# Patient Record
Sex: Male | Born: 2000 | Race: Black or African American | Hispanic: No | Marital: Single | Smoking: Current some day smoker
Health system: Southern US, Community
[De-identification: ages and names within clinical notes are randomized; demographics above are authoritative.]

## PROBLEM LIST (undated history)

## (undated) DIAGNOSIS — M4184 Other forms of scoliosis, thoracic region: Secondary | ICD-10-CM

## (undated) DIAGNOSIS — J45909 Unspecified asthma, uncomplicated: Secondary | ICD-10-CM

## (undated) HISTORY — DX: Unspecified asthma, uncomplicated: J45.909

## (undated) HISTORY — DX: Other forms of scoliosis, thoracic region: M41.84

---
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
## 2019-08-18 ENCOUNTER — Other Ambulatory Visit: Payer: Self-pay

## 2020-07-26 ENCOUNTER — Other Ambulatory Visit: Payer: Self-pay

## 2020-07-26 ENCOUNTER — Ambulatory Visit (INDEPENDENT_AMBULATORY_CARE_PROVIDER_SITE_OTHER): Payer: Medicaid Other | Admitting: Pulmonary Disease

## 2020-07-26 ENCOUNTER — Encounter: Payer: Self-pay | Admitting: Pulmonary Disease

## 2020-07-26 VITALS — BP 118/68 | HR 63 | Temp 97.7°F | Ht 76.0 in | Wt 171.8 lb

## 2020-07-26 DIAGNOSIS — Z7689 Persons encountering health services in other specified circumstances: Secondary | ICD-10-CM

## 2020-07-26 DIAGNOSIS — F17290 Nicotine dependence, other tobacco product, uncomplicated: Secondary | ICD-10-CM | POA: Diagnosis not present

## 2020-07-26 DIAGNOSIS — J45909 Unspecified asthma, uncomplicated: Secondary | ICD-10-CM

## 2020-07-26 DIAGNOSIS — J302 Other seasonal allergic rhinitis: Secondary | ICD-10-CM

## 2020-07-26 DIAGNOSIS — J3089 Other allergic rhinitis: Secondary | ICD-10-CM

## 2020-07-26 MED ORDER — AZELASTINE HCL 0.1 % NA SOLN: 1.0000 | Freq: Two times a day (BID) | NASAL | 12 refills | Status: DC

## 2020-07-26 MED ORDER — ALBUTEROL SULFATE HFA 108 (90 BASE) MCG/ACT IN AERS: 2.0000 | INHALATION_SPRAY | Freq: Four times a day (QID) | RESPIRATORY_TRACT | 3 refills | Status: DC | PRN

## 2020-07-26 NOTE — Patient Instructions (Addendum)
I recommend you get breathing tests and an allergy test that can be run in the blood.  However, you need to have a primary care physician to approve these tests.  I recommend that you call your social worker to get set up with a primary care physician.  We are going to send a new prescription for a nasal spray.  Over-the-counter you can get a medication called Zyrtec (cetirizine) that he can take 1 tablet daily as needed for nasal congestion  We will see you in follow-up in 3 months time call sooner should any new problems arise

## 2020-07-26 NOTE — Progress Notes (Signed)
Subjective:    Patient ID: Joshua Garret., male    DOB: [birth year], 20 y.o.   MRN: [id number]  HPI This is a 20 year old current cigar and marijuana smoker, who presents for evaluation of nasal congestion and postnasal drip.  Patient is self-referred.  Currently does not have a primary care physician.  Somewhat taciturn and does not endorse much of a history.  States that he was diagnosed with asthma at age 20.  However currently his complaint is not that of shortness of breath but rather of nasal congestion with severe postnasal drip.  He uses an albuterol inhaler as needed.  Recently stopped using his Flonase inhaler for perennial rhinitis.  He had been previously prescribed Flovent HFA but is not using that inhaler anymore.  He cannot tell me when he stopped using the inhaler.  He smokes marijuana on a daily basis and smokes cigars at least 3 times a week.  He has not had any military history.  He works at [employer] and for a [employer].   Review of Systems A 10 point review of systems was performed and it is as noted above otherwise negative.  Past Medical History:  Diagnosis Date  . Asthma   . Levoscoliosis of thoracic spine    No past surgical history on file.  Family History  Problem Relation Age of Onset  . Asthma Mother    Social History   Tobacco Use  . Smoking status: Current Some Day Smoker    Years: 4.00    Types: Cigars  . Smokeless tobacco: Never Used  . Tobacco comment: 1 cigar every 3 days--07/26/2020  Substance Use Topics  . Alcohol use: Not Currently   Not on File  Current Meds  Medication Sig  . albuterol (VENTOLIN HFA) 108 (90 Base) MCG/ACT inhaler Inhale 2 puffs into the lungs every 6 (six) hours as needed for wheezing or shortness of breath.  . fluticasone (FLONASE) 50 MCG/ACT nasal spray 2 sprays daily.       Immunization History  Administered Date(s) Administered  . Influenza-Unspecified 05/16/2014      Objective:   Physical Exam BP  118/68 (BP Location: Left Arm, Cuff Size: Normal)   Pulse 63   Temp 97.7 F (36.5 C) (Temporal)   Ht 6\' 4"  (1.93 m)   Wt 171 lb 12.8 oz (77.9 kg)   SpO2 98%   BMI 20.91 kg/m  GENERAL: Well-developed well-nourished young man, somewhat taciturn.  No acute distress.  No conversational dyspnea. HEAD: Normocephalic, atraumatic.  EYES: Pupils equal, round, reactive to light.  No scleral icterus.  MOUTH: Nasal mucosa boggy, erythematous, crusted.  Pharynx with clear post nasal drip.   NECK: Supple. No thyromegaly. Trachea midline. No JVD.  No adenopathy. PULMONARY: Good air entry bilaterally.  No adventitious sounds. CARDIOVASCULAR: S1 and S2. Regular rate and rhythm.  No rubs, murmurs or gallops heard. ABDOMEN: Benign. MUSCULOSKELETAL: No joint deformity, no clubbing, no edema.  NEUROLOGIC: No focal deficit noted, speech is fluent, no gait disturbance. SKIN: Intact,warm,dry.  On limited exam, no rashes. PSYCH: Taciturn otherwise normal.     Assessment & Plan:     ICD-10-CM   1. Asthma, unspecified asthma severity, unspecified whether complicated, unspecified whether persistent  J45.909 Pulmonary Function Test [facility] Only    CBC w/Diff    Allergen Panel (27) + IGE   Will obtain PFTs Will obtain allergen panel Currently does not appear to have any exacerbation of this Continue albuterol as needed  2.  Perennial allergic rhinitis with seasonal variation  J30.89    J30.2    Trial of azelastine Cetirizine as needed Allergen panel as above May need allergy or ENT eval  3. 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 Establishing care with new doctor, encounter for  Z76.89 Ambulatory referral to Internal Medicine   Patient has been advised that he needs to establish with a primary care physician Establishing here for pulmonary medicine   Orders Placed This Encounter  Procedures  . CBC w/Diff     Standing Status:   Future    Standing Expiration Date:   07/26/2021  . Allergen Panel (27) + IGE    Standing Status:   Future    Standing Expiration Date:   07/26/2021  . Ambulatory referral to Internal Medicine    Referral Priority:   Routine    Referral Type:   Consultation    Referral Reason:   Specialty Services Required    Requested Specialty:   Internal Medicine    Number of Visits Requested:   1  . Pulmonary Function Test [facility] Only    Standing Status:   Future    Standing Expiration Date:   07/26/2021    Scheduling Instructions:     4 weeks    Order Specific Question:   Full PFT: includes the following: basic spirometry, spirometry pre & post bronchodilator, diffusion capacity (DLCO), lung volumes    Answer:   Full PFT   Meds ordered this encounter  Medications  . albuterol (VENTOLIN HFA) 108 (90 Base) MCG/ACT inhaler    Sig: Inhale 2 puffs into the lungs every 6 (six) hours as needed for wheezing or shortness of breath.    Dispense:  8 g    Refill:  3  . azelastine (ASTELIN) 0.1 % nasal spray    Sig: Place 1 spray into both nostrils 2 (two) times daily.    Dispense:  30 mL    Refill:  12   Discussion:  Patient carries a diagnosis of asthma since childhood.  However does not appear to have an exacerbation at present.  Will obtain PFTs to determine the severity of this.  Currently his issues appear to be more related to upper airway congestion and particular nasal congestion and postnasal drip.  He has been advised on proper nasal hygiene.  Also been advised on discontinuing smoking cigars and marijuana.  Smoking in general causes issues with upper airway sensitivity.  The patient also has been advised that he needs to find a primary care physician to follow-up on general medical issues.  Gailen Shelter, MD [facility]   *This note was dictated using voice recognition software/Dragon.  Despite best efforts to proofread, errors can occur which can change the meaning.  Any  change was purely unintentional.

## 2020-08-01 ENCOUNTER — Other Ambulatory Visit: Payer: Medicaid Other | Attending: Pulmonary Disease

## 2020-08-02 ENCOUNTER — Ambulatory Visit: Payer: Medicaid Other | Attending: Pulmonary Disease

## 2021-03-27 ENCOUNTER — Encounter: Payer: Self-pay | Admitting: Otolaryngology

## 2021-04-01 NOTE — Discharge Instructions (Signed)
[facility] ENDOSCOPIC SINUS SURGERY [facility]  What is Functional Endoscopic Sinus Surgery?  The Surgery involves making the natural openings of the sinuses larger by removing the bony partitions that separate the sinuses from the nasal cavity.  The natural sinus lining is preserved as much as possible to allow the sinuses to resume normal function after the surgery.  In some patients nasal polyps (excessively swollen lining of the sinuses) may be removed to relieve obstruction of the sinus openings.  The surgery is performed through the nose using lighted scopes, which eliminates the need for incisions on the face.  A septoplasty is a different procedure which is sometimes performed with sinus surgery.  It involves straightening the boy partition that separates the two sides of your nose.  A crooked or deviated septum may need repair if is obstructing the sinuses or nasal airflow.  Turbinate reduction is also often performed during sinus surgery.  The turbinates are bony proturberances from the side walls of the nose which swell and can obstruct the nose in patients with sinus and allergy problems.  Their size can be surgically reduced to help relieve nasal obstruction.  What Can Sinus Surgery Do For Me?  Sinus surgery can reduce the frequency of sinus infections requiring antibiotic treatment.  This can provide improvement in nasal congestion, post-nasal drainage, facial pressure and nasal obstruction.  Surgery will NOT prevent you from ever having an infection again, so it usually only for patients who get infections 4 or more times yearly requiring antibiotics, or for infections that do not clear with antibiotics.  It will not cure nasal allergies, so patients with allergies may still require medication to treat their allergies after surgery. Surgery may improve headaches related to sinusitis, however, some people will continue to  require medication to control sinus headaches related to allergies.  Surgery will do nothing for other forms of headache (migraine, tension or cluster).  What Are the Risks of Endoscopic Sinus Surgery?  Current techniques allow surgery to be performed safely with little risk, however, there are rare complications that patients should be aware of.  Because the sinuses are located around the eyes, there is risk of eye injury, including blindness, though again, this would be quite rare. This is usually a result of bleeding behind the eye during surgery, which can effect vision, though there are treatments to protect the vision and prevent permanent injury. More serious complications would include bleeding inside the brain cavity or damage to the brain.This happens when the fluid around the brain leaks out into the sinus cavity.  Again, all of these complications are uncommon, and spinal fluid leaks can be safely managed surgically if they occur.  The most common complication of sinus surgery is bleeding from the nose, which may require packing or cauterization of the nose.  Patients with polyps may experience recurrence of the polyps that would require revision surgery.  Alterations of sense of smell or injury to the tear ducts are also rare complications.   What is the Surgery Like, and what is the Recovery?  The Surgery usually takes a couple of hours to perform, and is usually performed under a general anesthetic (completely asleep).  Patients are usually discharged home after a couple of hours.  Sometimes during surgery it is necessary to pack the nose to control bleeding, and the packing is left in place for 24 - 48 hours, and removed by your surgeon.  If   a septoplasty was performed during the procedure, there is often a splint placed which must be removed after 5-7 days.   Discomfort: Pain is usually mild to moderate, and can be controlled by prescription pain medication or acetaminophen (Tylenol).   Aspirin, Ibuprofen (Advil, Motrin), or Naprosyn (Aleve) should be avoided, as they can cause increased bleeding.  Most patients feel sinus pressure like they have a bad head cold for several days.  Sleeping with your head elevated can help reduce swelling and facial pressure, as can ice packs over the face.  A humidifier may be helpful to keep the mucous and blood from drying in the nose.   Diet: There are no specific diet restrictions, however, you should generally start with clear liquids and a light diet of bland foods because the anesthetic can cause some nausea.  Advance your diet depending on how your stomach feels.  Taking your pain medication with food will often help reduce stomach upset which pain medications can cause.  Nasal Saline Irrigation: It is important to remove blood clots and dried mucous from the nose as it is healing.  This is done by having you irrigate the nose at least 3 - 4 times daily with a salt water solution.  We recommend using NeilMed Sinus Rinse (available at the drug store).  Fill the squeeze bottle with the solution, bend over a sink, and insert the tip of the squeeze bottle into the nose  of an inch.  Point the tip of the squeeze bottle towards the inside corner of the eye on the same side your irrigating.  Squeeze the bottle and gently irrigate the nose.  If you bend forward as you do this, most of the fluid will flow back out of the nose, instead of down your throat.   The solution should be warm, near body temperature, when you irrigate.   Each time you irrigate, you should use a full squeeze bottle.   Note that if you are instructed to use Nasal Steroid Sprays at any time after your surgery, irrigate with saline BEFORE using the steroid spray, so you do not wash it all out of the nose. Another product, Nasal Saline Gel (such as AYR Nasal Saline Gel) can be applied in each nostril 3 - 4 times daily to moisture the nose and reduce scabbing or crusting.  Bleeding:   Bloody drainage from the nose can be expected for several days, and patients are instructed to irrigate their nose frequently with salt water to help remove mucous and blood clots.  The drainage may be dark red or brown, though some fresh blood may be seen intermittently, especially after irrigation.  Do not blow you nose, as bleeding may occur. If you must sneeze, keep your mouth open to allow air to escape through your mouth.  If heavy bleeding occurs: Irrigate the nose with saline to rinse out clots, then spray the nose 3 - 4 times with Afrin Nasal Decongestant Spray.  The spray will constrict the blood vessels to slow bleeding.  Pinch the lower half of your nose shut to apply pressure, and lay down with your head elevated.  Ice packs over the nose may help as well. If bleeding persists despite these measures, you should notify your doctor.  Do not use the Afrin routinely to control nasal congestion after surgery, as it can result in worsening congestion and may affect healing.     Activity: Return to work varies among patients. Most patients will be out   of work at least 5 - 7 days to recover.  Patient may return to work after they are off of narcotic pain medication, and feeling well enough to perform the functions of their job.  Patients must avoid heavy lifting (over 10 pounds) or strenuous physical for 2 weeks after surgery, so your employer may need to assign you to light duty, or keep you out of work longer if light duty is not possible.  NOTE: you should not drive, operate dangerous machinery, do any mentally demanding tasks or make any important legal or financial decisions while on narcotic pain medication and recovering from the general anesthetic.    Call Your Doctor Immediately if You Have Any of the Following: Bleeding that you cannot control with the above measures Loss of vision, double vision, bulging of the eye or black eyes. Fever over 101 degrees Neck stiffness with severe headache,  fever, nausea and change in mental state. You are always encouraged to call anytime with concerns, however, please call with requests for pain medication refills during office hours.  Office Endoscopy: During follow-up visits your doctor will remove any packing or splints that may have been placed and evaluate and clean your sinuses endoscopically.  Topical anesthetic will be used to make this as comfortable as possible, though you may want to take your pain medication prior to the visit.  How often this will need to be done varies from patient to patient.  After complete recovery from the surgery, you may need follow-up endoscopy from time to time, particularly if there is concern of recurrent infection or nasal polyps.  

## 2021-04-04 ENCOUNTER — Ambulatory Visit
Admission: RE | Admit: 2021-04-04 | Discharge: 2021-04-04 | Disposition: A | Discharge status: Home / Self Care | Payer: Medicaid Other | Source: Ambulatory Visit | Attending: Otolaryngology | Admitting: Otolaryngology

## 2021-04-04 ENCOUNTER — Ambulatory Visit: Payer: Medicaid Other | Admitting: Anesthesiology

## 2021-04-04 ENCOUNTER — Encounter: Payer: Self-pay | Admitting: Otolaryngology

## 2021-04-04 ENCOUNTER — Encounter
Admission: RE | Disposition: A | Discharge status: Home / Self Care | Payer: Self-pay | Source: Ambulatory Visit | Attending: Otolaryngology

## 2021-04-04 ENCOUNTER — Other Ambulatory Visit: Payer: Self-pay

## 2021-04-04 DIAGNOSIS — J343 Hypertrophy of nasal turbinates: Secondary | ICD-10-CM | POA: Diagnosis not present

## 2021-04-04 DIAGNOSIS — F17200 Nicotine dependence, unspecified, uncomplicated: Secondary | ICD-10-CM | POA: Insufficient documentation

## 2021-04-04 DIAGNOSIS — J342 Deviated nasal septum: Secondary | ICD-10-CM | POA: Diagnosis not present

## 2021-04-04 DIAGNOSIS — J45909 Unspecified asthma, uncomplicated: Secondary | ICD-10-CM | POA: Insufficient documentation

## 2021-04-04 HISTORY — PX: TURBINATE REDUCTION: SHX6157

## 2021-04-04 HISTORY — PX: SEPTOPLASTY: SHX2393

## 2021-04-04 SURGERY — SEPTOPLASTY, NOSE
Anesthesia: General | Site: Nose

## 2021-04-04 MED ORDER — FENTANYL CITRATE PF 50 MCG/ML IJ SOSY
25.0000 ug | PREFILLED_SYRINGE | INTRAMUSCULAR | Status: DC | PRN
  Administered 2021-04-04: 25 ug via INTRAVENOUS

## 2021-04-04 MED ORDER — OXYCODONE HCL 5 MG/5ML PO SOLN
5.0000 mg | Freq: Once | ORAL | Status: AC | PRN
  Administered 2021-04-04: 5 mg via ORAL

## 2021-04-04 MED ORDER — PROPOFOL 10 MG/ML IV BOLUS
INTRAVENOUS | Status: DC | PRN
  Administered 2021-04-04: 200 mg via INTRAVENOUS

## 2021-04-04 MED ORDER — PHENYLEPHRINE HCL 0.5 % NA SOLN
NASAL | Status: DC | PRN
  Administered 2021-04-04: 15 mL via TOPICAL

## 2021-04-04 MED ORDER — ONDANSETRON HCL 4 MG/2ML IJ SOLN
INTRAMUSCULAR | Status: DC | PRN
  Administered 2021-04-04: 4 mg via INTRAVENOUS

## 2021-04-04 MED ORDER — HYDROCODONE-ACETAMINOPHEN 5-325 MG PO TABS: 1.0000 | ORAL_TABLET | Freq: Four times a day (QID) | ORAL | 0 refills | Status: AC | PRN

## 2021-04-04 MED ORDER — ACETAMINOPHEN 160 MG/5ML PO SOLN: 325.0000 mg | ORAL | Status: DC | PRN

## 2021-04-04 MED ORDER — CEPHALEXIN 500 MG PO CAPS: 500.0000 mg | ORAL_CAPSULE | Freq: Two times a day (BID) | ORAL | 0 refills | Status: AC

## 2021-04-04 MED ORDER — DEXMEDETOMIDINE (PRECEDEX) IN NS 20 MCG/5ML (4 MCG/ML) IV SYRINGE
PREFILLED_SYRINGE | INTRAVENOUS | Status: DC | PRN
  Administered 2021-04-04: 10 ug via INTRAVENOUS

## 2021-04-04 MED ORDER — SUCCINYLCHOLINE CHLORIDE 200 MG/10ML IV SOSY
PREFILLED_SYRINGE | INTRAVENOUS | Status: DC | PRN
  Administered 2021-04-04: 100 mg via INTRAVENOUS

## 2021-04-04 MED ORDER — GLYCOPYRROLATE 0.2 MG/ML IJ SOLN
INTRAMUSCULAR | Status: DC | PRN
  Administered 2021-04-04: .1 mg via INTRAVENOUS

## 2021-04-04 MED ORDER — OXYMETAZOLINE HCL 0.05 % NA SOLN
2.0000 | Freq: Once | NASAL | Status: AC
  Administered 2021-04-04: 2 via NASAL

## 2021-04-04 MED ORDER — DEXAMETHASONE SODIUM PHOSPHATE 4 MG/ML IJ SOLN
INTRAMUSCULAR | Status: DC | PRN
  Administered 2021-04-04: 10 mg via INTRAVENOUS

## 2021-04-04 MED ORDER — OXYCODONE HCL 5 MG PO TABS: 5.0000 mg | ORAL_TABLET | Freq: Once | ORAL | Status: AC | PRN

## 2021-04-04 MED ORDER — ACETAMINOPHEN 325 MG PO TABS: 325.0000 mg | ORAL_TABLET | ORAL | Status: DC | PRN

## 2021-04-04 MED ORDER — LIDOCAINE-EPINEPHRINE 1 %-1:100000 IJ SOLN
INTRAMUSCULAR | Status: DC | PRN
  Administered 2021-04-04: 6 mL

## 2021-04-04 MED ORDER — LACTATED RINGERS IV SOLN: INTRAVENOUS | Status: DC

## 2021-04-04 MED ORDER — ACETAMINOPHEN 10 MG/ML IV SOLN
1000.0000 mg | Freq: Once | INTRAVENOUS | Status: AC
  Administered 2021-04-04: 1000 mg via INTRAVENOUS

## 2021-04-04 MED ORDER — LIDOCAINE HCL (CARDIAC) PF 100 MG/5ML IV SOSY
PREFILLED_SYRINGE | INTRAVENOUS | Status: DC | PRN
  Administered 2021-04-04: 50 mg via INTRAVENOUS

## 2021-04-04 MED ORDER — MIDAZOLAM HCL 5 MG/5ML IJ SOLN
INTRAMUSCULAR | Status: DC | PRN
  Administered 2021-04-04: 2 mg via INTRAVENOUS

## 2021-04-04 MED ORDER — DEXTROSE 5 % IV SOLN
2000.0000 mg | Freq: Once | INTRAVENOUS | Status: AC
  Administered 2021-04-04: 2000 mg via INTRAVENOUS

## 2021-04-04 MED ORDER — FENTANYL CITRATE (PF) 100 MCG/2ML IJ SOLN
INTRAMUSCULAR | Status: DC | PRN
  Administered 2021-04-04: 50 ug via INTRAVENOUS

## 2021-04-04 MED ORDER — PREDNISONE 10 MG PO TABS: ORAL_TABLET | ORAL | 0 refills | Status: DC

## 2021-04-04 SURGICAL SUPPLY — 24 items
CANISTER SUCT 1200ML W/VALVE (MISCELLANEOUS) ×3 IMPLANT
COAGULATOR SUCT 8FR VV (MISCELLANEOUS) ×3 IMPLANT
ELECT REM PT RETURN 9FT ADLT (ELECTROSURGICAL) ×3
ELECTRODE REM PT RTRN 9FT ADLT (ELECTROSURGICAL) ×2 IMPLANT
GAUZE 4X4 16PLY ~~LOC~~+RFID DBL (SPONGE) ×3 IMPLANT
GLOVE SURG GAMMEX PI TX LF 7.5 (GLOVE) ×6 IMPLANT
GOWN STRL REUS W/ TWL LRG LVL3 (GOWN DISPOSABLE) ×2 IMPLANT
GOWN STRL REUS W/TWL LRG LVL3 (GOWN DISPOSABLE) ×3
KIT TURNOVER KIT A (KITS) ×3 IMPLANT
NEEDLE ANESTHESIA  27G X 3.5 (NEEDLE) ×1
NEEDLE ANESTHESIA 27G X 3.5 (NEEDLE) ×2 IMPLANT
NEEDLE HYPO 27GX1-1/4 (NEEDLE) ×3 IMPLANT
PACK ENT CUSTOM (PACKS) ×3 IMPLANT
PATTIES SURGICAL .5 X3 (DISPOSABLE) ×3 IMPLANT
SPLINT NASAL SEPTAL BLV .50 ST (MISCELLANEOUS) ×3 IMPLANT
STRAP BODY AND KNEE 60X3 (MISCELLANEOUS) ×3 IMPLANT
SUT CHROMIC 3-0 (SUTURE) ×3
SUT CHROMIC 3-0 KS 27XMFL CR (SUTURE) ×2
SUT ETHILON 3-0 KS 30 BLK (SUTURE) ×3 IMPLANT
SUT PLAIN GUT 4-0 (SUTURE) ×3 IMPLANT
SUTURE CHRMC 3-0 KS 27XMFL CR (SUTURE) ×2 IMPLANT
SYR 3ML LL SCALE MARK (SYRINGE) ×3 IMPLANT
TOWEL OR 17X26 4PK STRL BLUE (TOWEL DISPOSABLE) ×3 IMPLANT
WATER STERILE IRR 250ML POUR (IV SOLUTION) ×3 IMPLANT

## 2021-04-04 NOTE — Anesthesia Postprocedure Evaluation (Signed)
Anesthesia Post Note  Patient: Joshua Livingston.  Procedure(s) Performed: SEPTOPLASTY (Nose) BILATERAL INFERIOR TURBINATE REDUCTION (Bilateral: Nose)     Patient location during evaluation: PACU Anesthesia Type: General Level of consciousness: awake Pain management: pain level controlled Vital Signs Assessment: post-procedure vital signs reviewed and stable Respiratory status: respiratory function stable Cardiovascular status: stable Postop Assessment: no signs of nausea or vomiting Anesthetic complications: no   No notable events documented.  Jola Babinski

## 2021-04-04 NOTE — Transfer of Care (Signed)
Immediate Anesthesia Transfer of Care Note  Patient: Joshua Livingston.  Procedure(s) Performed: SEPTOPLASTY (Nose) BILATERAL INFERIOR TURBINATE REDUCTION (Bilateral: Nose)  Patient Location: PACU  Anesthesia Type: General  Level of Consciousness: awake, alert  and patient cooperative  Airway and Oxygen Therapy: Patient Spontanous Breathing and Patient connected to supplemental oxygen  Post-op Assessment: Post-op Vital signs reviewed, Patient's Cardiovascular Status Stable, Respiratory Function Stable, Patent Airway and No signs of Nausea or vomiting  Post-op Vital Signs: Reviewed and stable  Complications: No notable events documented.

## 2021-04-04 NOTE — Anesthesia Preprocedure Evaluation (Signed)
Anesthesia Evaluation  Patient identified by MRN, date of birth, ID band  Reviewed: Allergy & Precautions, NPO status   Airway Mallampati: II  TM Distance: >3 FB     Dental   Pulmonary asthma , Current Smoker (smokes tobacco and marijuana - last time a week ago) and Patient abstained from smoking.,    Pulmonary exam normal        Cardiovascular negative cardio ROS   Rhythm:Regular Rate:Normal     Neuro/Psych    GI/Hepatic negative GI ROS,   Endo/Other    Renal/GU      Musculoskeletal   Abdominal   Peds  Hematology   Anesthesia Other Findings   Reproductive/Obstetrics                             Anesthesia Physical Anesthesia Plan  ASA: 2  Anesthesia Plan: General   Post-op Pain Management:    Induction: Intravenous  PONV Risk Score and Plan: Ondansetron, Dexamethasone, Midazolam and Treatment may vary due to age or medical condition  Airway Management Planned: Oral ETT  Additional Equipment:   Intra-op Plan:   Post-operative Plan:   Informed Consent: I have reviewed the patients History and Physical, chart, labs and discussed the procedure including the risks, benefits and alternatives for the proposed anesthesia with the patient or authorized representative who has indicated his/her understanding and acceptance.     Dental advisory given  Plan Discussed with: CRNA  Anesthesia Plan Comments:         Anesthesia Quick Evaluation

## 2021-04-04 NOTE — H&P (Signed)
H&P has been reviewed and patient reevaluated, no changes necessary. To be downloaded later.  

## 2021-04-04 NOTE — Anesthesia Procedure Notes (Signed)
Procedure Name: Intubation Date/Time: 04/04/2021 8:19 AM Performed by: Jimmy Picket, CRNA Pre-anesthesia Checklist: Patient identified, Emergency Drugs available, Suction available, Patient being monitored and Timeout performed Patient Re-evaluated:Patient Re-evaluated prior to induction Oxygen Delivery Method: Circle system utilized Preoxygenation: Pre-oxygenation with 100% oxygen Induction Type: IV induction Ventilation: Mask ventilation without difficulty Laryngoscope Size: Miller and 3 Grade View: Grade I Tube type: Oral Rae Tube size: 8.0 mm Number of attempts: 1 Placement Confirmation: ETT inserted through vocal cords under direct vision, positive ETCO2 and breath sounds checked- equal and bilateral Tube secured with: Tape Dental Injury: Teeth and Oropharynx as per pre-operative assessment  Comments: Unable to get ETT to pass through cords. Grade I view. Bougie passed through cords without difficulty. 8.0 oral rae passed over bougie. +/= BBS. + EtCO2.

## 2021-04-04 NOTE — Op Note (Signed)
04/04/2021  9:17 AM  [id number]   Pre-Op Dx:  Deviated Nasal Septum, Hypertrophic Inferior Turbinates  Post-op Dx: Same  Proc: Nasal Septoplasty, Bilateral Partial Reduction Inferior Turbinates   Surg:  Beverly Sessions Indalecio Malmstrom  Anes:  GOT  EBL: 50 mL  Comp: None  Findings: Very deviated ethmoid plate and vomer off to the left side.  His right inferior turbinate was very enlarged and the left side not as bad.  Procedure: With the patient in a comfortable supine position,  general orotracheal anesthesia was induced without difficulty.     The patient received preoperative Afrin spray for topical decongestion and vasoconstriction.  Intravenous prophylactic antibiotics were administered.  At an appropriate level, the patient was placed in a semi-sitting position.  Nasal vibrissae were trimmed.   1% Xylocaine with 1:100,000 epinephrine,  6 cc's, was infiltrated into the anterior floor of the nose, into the nasal spine region, into the membranous columella, and finally into the submucoperichondrial plane of the septum on both sides.  Several minutes were allowed for this to take effect.  Cottoniod pledgetts soaked in Afrin and 4% Xylocaine were placed into both nasal cavities and left while the patient was prepped and draped in the standard fashion.  The materials were removed from the nose and observed to be intact and correct in number.  The nose was inspected with a headlight and zero degree scope with the findings as described above.  A left Killian incision was sharply executed and carried down to the quadrangular cartilage. The mucoperichondrium was elelvated along the quadrangular plate back to the bony-cartilaginous junction. The mucoperiostium was then elevated along the ethmoid plate and the vomer. The boney-catilaginous junction was then split with a freer elevator and the mucoperiosteum was elevated on the opposite side. The mucoperiosteum was then elevated along the maxillary crest as  needed to expose the crooked bone of the crest.  Boney spurs of the vomer and maxillary crest were removed with Lenoria Chime forceps.  The cartilaginous plate was trimmed along its posterior and inferior borders of about 2 mm of cartilage to free it up inferiorly. Some of the deviated ethmoid plate was then fractured and removed with Takahashi forceps to free up the posterior border of the quadrangular plate and allow it to swing back to the midline. The mucosal flaps were placed back into their anatomic position to allow visualization of the airways. The septum now sat in the midline with an improved airway.  A 3-0 Chromic suture on a Keith needle in used to anchor the inferior septum at the nasal spine with a through and through suture. The mucosal flaps are then sutured together using a through and through whip stitch of 4-0 Plain Gut with a mini-Keith needle. This was used to close the [place] incision as well.   The inferior turbinates were then inspected. An incision was created along the inferior aspect of the left inferior turbinate with removal of some of the inferior soft tissue and bone. Electrocautery was used to control bleeding in the area. The remaining turbinate was then outfractured to open up the airway further. There was no significant bleeding noted. The right turbinate was significantly larger and obstructed more.  More trimming was done on the right side.  It was then trimmed and outfractured in a similar fashion.  The airways were then visualized and showed open passageways on both sides that were significantly improved compared to before surgery. There was no signifcant bleeding. Nasal splints were applied to both  sides of the septum using Xomed 0.22mm regular sized splints that were trimmed, and then held in position with a 3-0 Nylon through and through suture.  The patient was turned back over to anesthesia, and awakened, extubated, and taken to the PACU in satisfactory  condition.  Dispo:   PACU to home  Plan: Ice, elevation, narcotic analgesia, steroid taper, and prophylactic antibiotics for the duration of indwelling nasal foreign bodies.  We will reevaluate the patient in the office in 6 days and remove the septal splints.  Return to work in 10 days, strenuous activities in two weeks.   Beverly Sessions Chevon Laufer 04/04/2021 9:17 AM

## 2021-04-05 ENCOUNTER — Encounter: Payer: Self-pay | Admitting: Otolaryngology

## 2022-11-29 ENCOUNTER — Emergency Department (HOSPITAL_COMMUNITY)
Admission: EM | Admit: 2022-11-29 | Discharge: 2022-11-29 | Disposition: A | Discharge status: Home / Self Care | Payer: BC Managed Care – PPO | Attending: Emergency Medicine | Admitting: Emergency Medicine

## 2022-11-29 DIAGNOSIS — L249 Irritant contact dermatitis, unspecified cause: Secondary | ICD-10-CM | POA: Insufficient documentation

## 2022-11-29 DIAGNOSIS — R21 Rash and other nonspecific skin eruption: Secondary | ICD-10-CM | POA: Diagnosis present
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
## 2023-05-16 ENCOUNTER — Emergency Department: Payer: BC Managed Care – PPO

## 2023-05-16 ENCOUNTER — Emergency Department
Admission: EM | Admit: 2023-05-16 | Discharge: 2023-05-16 | Disposition: A | Discharge status: Home / Self Care | Payer: BC Managed Care – PPO | Attending: Emergency Medicine | Admitting: Emergency Medicine

## 2023-05-16 DIAGNOSIS — J45909 Unspecified asthma, uncomplicated: Secondary | ICD-10-CM | POA: Diagnosis not present

## 2023-05-16 DIAGNOSIS — Z20822 Contact with and (suspected) exposure to covid-19: Secondary | ICD-10-CM | POA: Insufficient documentation

## 2023-05-16 DIAGNOSIS — R0602 Shortness of breath: Secondary | ICD-10-CM | POA: Diagnosis present

## 2023-05-16 DIAGNOSIS — J208 Acute bronchitis due to other specified organisms: Secondary | ICD-10-CM | POA: Insufficient documentation

## 2023-05-16 DIAGNOSIS — B974 Respiratory syncytial virus as the cause of diseases classified elsewhere: Secondary | ICD-10-CM | POA: Diagnosis not present

## 2023-05-16 DIAGNOSIS — J205 Acute bronchitis due to respiratory syncytial virus: Secondary | ICD-10-CM

## 2023-05-16 LAB — RESP PANEL BY RT-PCR (RSV, FLU A&B, COVID)  RVPGX2
Influenza A by PCR: NEGATIVE
Influenza B by PCR: NEGATIVE
Resp Syncytial Virus by PCR: POSITIVE — AB
SARS Coronavirus 2 by RT PCR: NEGATIVE

## 2023-05-16 LAB — GROUP A STREP BY PCR: Group A Strep by PCR: NOT DETECTED

## 2023-05-16 MED ORDER — PSEUDOEPHEDRINE HCL 30 MG PO TABS
60.0000 mg | ORAL_TABLET | Freq: Once | ORAL | Status: AC
  Administered 2023-05-16: 60 mg via ORAL
  Filled 2023-05-16: qty 2

## 2023-05-16 MED ORDER — PREDNISONE 50 MG PO TABS: 50.0000 mg | ORAL_TABLET | Freq: Every day | ORAL | 0 refills | Status: AC

## 2023-05-16 MED ORDER — ALBUTEROL SULFATE (2.5 MG/3ML) 0.083% IN NEBU
2.5000 mg | INHALATION_SOLUTION | Freq: Once | RESPIRATORY_TRACT | Status: AC
Start: 2023-05-16 — End: 2023-05-16
  Administered 2023-05-16: 2.5 mg via RESPIRATORY_TRACT
  Filled 2023-05-16: qty 3

## 2023-05-16 MED ORDER — IPRATROPIUM-ALBUTEROL 0.5-2.5 (3) MG/3ML IN SOLN
6.0000 mL | Freq: Once | RESPIRATORY_TRACT | Status: DC
  Filled 2023-05-16: qty 6

## 2023-05-16 MED ORDER — PREDNISONE 20 MG PO TABS
60.0000 mg | ORAL_TABLET | Freq: Once | ORAL | Status: AC
Start: 2023-05-16 — End: 2023-05-16
  Administered 2023-05-16: 60 mg via ORAL
  Filled 2023-05-16: qty 3

## 2023-05-16 MED ORDER — ALBUTEROL SULFATE HFA 108 (90 BASE) MCG/ACT IN AERS: 2.0000 | INHALATION_SPRAY | RESPIRATORY_TRACT | 0 refills | Status: AC | PRN

## 2023-05-16 MED ORDER — IBUPROFEN 600 MG PO TABS: 600.0000 mg | ORAL_TABLET | Freq: Four times a day (QID) | ORAL | 0 refills | Status: AC | PRN

## 2023-05-16 MED ORDER — IBUPROFEN 600 MG PO TABS
600.0000 mg | ORAL_TABLET | Freq: Once | ORAL | Status: AC
  Administered 2023-05-16: 600 mg via ORAL
  Filled 2023-05-16: qty 1

## 2023-05-16 MED ORDER — ALBUTEROL SULFATE (2.5 MG/3ML) 0.083% IN NEBU
2.5000 mL | INHALATION_SOLUTION | RESPIRATORY_TRACT | Status: DC | PRN
  Administered 2023-05-16: 2.5 mL via RESPIRATORY_TRACT
  Filled 2023-05-16: qty 3
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
## 2023-05-16 NOTE — ED Triage Notes (Addendum)
Pt presents from home for sore throat, chest tightness, increased mucous production, HA, fatigue x4 days. Denies sick contacts.  States that he gets bronchitis every year. H/o asthma, ran out of rescue inhaler

## 2023-05-16 NOTE — Discharge Instructions (Addendum)
Use the albuterol inhaler every 4-6 hours as needed for shortness of breath.  Take the prednisone daily starting tomorrow.  Use the ibuprofen as needed for body aches, fever, or pain.  Make sure to drink plenty of fluids.  Return to the ER for new, worsening, or persistent severe shortness of breath, fever, weakness, or any other new or worsening symptoms that concern you.

## 2023-05-16 NOTE — ED Notes (Signed)
See triage notes. Patient c/o sore throat, chest tightness, headache and increased mucus times four days.
